# Patient Record
Sex: Male | Born: 2005
Health system: Southern US, Community
[De-identification: ages and names within clinical notes are randomized; demographics above are authoritative.]

## PROBLEM LIST (undated history)

## (undated) DIAGNOSIS — J45909 Unspecified asthma, uncomplicated: Secondary | ICD-10-CM

## (undated) DIAGNOSIS — F988 Other specified behavioral and emotional disorders with onset usually occurring in childhood and adolescence: Secondary | ICD-10-CM

---
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
## 2024-01-25 ENCOUNTER — Other Ambulatory Visit: Payer: Self-pay

## 2024-01-26 ENCOUNTER — Ambulatory Visit

## 2024-02-22 ENCOUNTER — Other Ambulatory Visit: Payer: Self-pay

## 2024-02-22 ENCOUNTER — Other Ambulatory Visit: Payer: Self-pay | Admitting: Urology

## 2024-02-23 ENCOUNTER — Other Ambulatory Visit: Payer: Self-pay

## 2024-02-23 ENCOUNTER — Other Ambulatory Visit: Payer: Self-pay | Admitting: Internal Medicine

## 2024-02-23 ENCOUNTER — Telehealth: Payer: Self-pay | Admitting: Pharmacy Technician

## 2024-02-23 ENCOUNTER — Other Ambulatory Visit (HOSPITAL_COMMUNITY): Payer: Self-pay

## 2024-02-23 DIAGNOSIS — L700 Acne vulgaris: Secondary | ICD-10-CM

## 2024-02-23 NOTE — Telephone Encounter (Signed)
 Pharmacy Patient Advocate Encounter   Received notification from CoverMyMeds that prior authorization for Tretinoin  0.025% cream is required/requested.   Insurance verification completed.   The patient is insured through [employer] [place] [employer].   Per test claim:  see below is preferred by the insurance.  If suggested medication is appropriate, Please send in a new RX and discontinue this one. If not, please advise as to why it's not appropriate so that we may request a Prior Authorization. Please note, some preferred medications may still require a PA.  If the suggested medications have not been trialed and there are no contraindications to their use, the PA will not be submitted, as it will not be approved.  Medicaid discontinued coverage of brand name Retin-A  on 12/23/2023 and the generic in non-preferred.

## 2024-02-24 ENCOUNTER — Other Ambulatory Visit: Payer: Self-pay | Admitting: Pharmacy Technician

## 2024-02-24 NOTE — Telephone Encounter (Signed)
 Therapy changed to Clindamycin -Benzoyl Per, Refr, gel .

## 2024-03-03 ENCOUNTER — Other Ambulatory Visit (HOSPITAL_COMMUNITY): Payer: Self-pay

## 2024-03-10 ENCOUNTER — Telehealth: Payer: Self-pay

## 2024-03-10 NOTE — Telephone Encounter (Unsigned)
 Copied from CRM #[id number]. Topic: Clinical - Prescription Issue >> Mar 10, 2024  3:46 PM Avram G wrote: Reason for CRM: patient would like to know if he can have this medication refill tretinoin  (RETIN-A ) 0.025 % cream    [id number] - [place], [place] - [address] AT [facility] [address] [place] [address] Phone: [phone] Fax: [phone]

## 2024-03-11 NOTE — Telephone Encounter (Signed)
 His insurance denied this.

## 2024-03-21 ENCOUNTER — Ambulatory Visit: Payer: MEDICAID

## 2024-03-21 VITALS — BP 139/87 | HR 111 | Ht 74.0 in | Wt 155.1 lb

## 2024-03-21 DIAGNOSIS — L700 Acne vulgaris: Secondary | ICD-10-CM

## 2024-03-21 DIAGNOSIS — R61 Generalized hyperhidrosis: Secondary | ICD-10-CM

## 2024-03-21 DIAGNOSIS — F411 Generalized anxiety disorder: Secondary | ICD-10-CM | POA: Diagnosis not present

## 2024-03-21 DIAGNOSIS — E559 Vitamin D deficiency, unspecified: Secondary | ICD-10-CM

## 2024-03-21 DIAGNOSIS — R748 Abnormal levels of other serum enzymes: Secondary | ICD-10-CM | POA: Diagnosis not present

## 2024-03-21 DIAGNOSIS — R7989 Other specified abnormal findings of blood chemistry: Secondary | ICD-10-CM

## 2024-03-21 MED ORDER — SERTRALINE HCL 100 MG PO TABS: 100.0000 mg | ORAL_TABLET | Freq: Every day | ORAL | 3 refills | Status: AC

## 2024-03-21 MED ORDER — ADAPALENE 0.1 % EX GEL: Freq: Every day | CUTANEOUS | 11 refills | Status: AC

## 2024-03-21 NOTE — Progress Notes (Unsigned)
 "  Established Patient Office Visit  Subjective   Patient ID: Michael Wilkins, male    DOB: 09/25/2005  Age: 18 y.o. MRN: [id number]  Chief Complaint  Patient presents with   Medical Management of Chronic Issues    Pt here for a 6 month follow up, pt has concerns of sweating a lot and he's not hot just Sweating wanting his TSH levels checked     HPI Discussed the use of AI scribe software for clinical note transcription with the patient, who gave verbal consent to proceed.  History of Present Illness    Michael Wilkins is a 18 year old male who presents with concerns about elevated TSH levels and medication refill issues.  Thyroid dysfunction - Elevated TSH levels noted on prior laboratory testing - No treatment initiated previously due to absence of symptoms at that time  Diaphoresis - Sweating occurs throughout the day and night - Described as an 'all day thing'  Medication refill issues - Tretinoin  prescription for facial use denied by insurance; reason unknown - Sertraline  taken daily; last filled in January and likely on last refill     Patient Active Problem List   Diagnosis Date Noted   Abnormal TSH 03/22/2024   Vitamin D  deficiency 03/22/2024   Elevated alkaline phosphatase level 03/22/2024   Diaphoresis 03/22/2024   Encounter for well adult exam with abnormal findings 07/24/2023   Need for HPV vaccination 07/24/2023   Penile lesion 08/05/2022   History of Wolff-Parkinson-White (WPW) syndrome 10/01/2021   S/P ablation of ventricular arrhythmia 05/09/2021   Acne vulgaris 04/14/2019   Generalized anxiety disorder 04/14/2019    ROS    Objective:     BP 139/87   Pulse (!) 111   Ht 6' 2 (1.88 m)   Wt 155 lb 1.9 oz (70.4 kg)   SpO2 94%   BMI 19.92 kg/m  BP Readings from Last 3 Encounters:  03/21/24 139/87  09/15/23 133/78  07/24/23 133/71   Wt Readings from Last 3 Encounters:  03/21/24 155 lb 1.9 oz (70.4 kg) (50%, Z= 0.00)*  07/24/23 158 lb 3.2 oz  (71.8 kg) (58%, Z= 0.21)*  05/21/23 150 lb (68 kg) (46%, Z= -0.10)*   * Growth percentiles are based on CDC (Boys, 2-20 Years) data.     Physical Exam Vitals and nursing note reviewed.  Constitutional:      Appearance: Normal appearance.  HENT:     Head: Normocephalic.  Eyes:     Extraocular Movements: Extraocular movements intact.     Pupils: Pupils are equal, round, and reactive to light.  Cardiovascular:     Rate and Rhythm: Normal rate and regular rhythm.  Pulmonary:     Effort: Pulmonary effort is normal.     Breath sounds: Normal breath sounds.  Musculoskeletal:     Cervical back: Normal range of motion and neck supple.  Neurological:     Mental Status: He is alert and oriented to person, place, and time.  Psychiatric:        Mood and Affect: Mood normal.        Thought Content: Thought content normal.        The ASCVD Risk score (Arnett DK, et al., 2019) failed to calculate for the following reasons:   The 2019 ASCVD risk score is only valid for ages 49 to 51   * - Cholesterol units were assumed    Assessment & Plan:   Problem List Items Addressed This Visit  Musculoskeletal and Integument   Acne vulgaris   Insurance denied tretinoin . Discussed alternative topical treatments. - Prescribed alternative topical gel. - Sent prescription to [facility] in [place].      Relevant Medications   adapalene  (DIFFERIN ) 0.1 % gel     Other   Generalized anxiety disorder   Continues sertraline  for management. - Refilled sertraline  prescription.      Relevant Medications   sertraline  (ZOLOFT ) 100 MG tablet   Abnormal TSH   Elevated TSH suggests possible hypothyroidism. No treatment initiated due to asymptomatic status. - Rechecked TSH levels.      Relevant Orders   TSH + free T4 (Completed)   Vitamin D  deficiency   Recheck vitamin D  levels.       Relevant Orders   VITAMIN D  25 Hydroxy (Vit-D Deficiency, Fractures) (Completed)   Elevated alkaline  phosphatase level - Primary   Elevated on previous labs.  Will recheck levels.       Relevant Orders   CMP14+EGFR (Completed)   Diaphoresis   Will obtain labs for further evaluation.       Relevant Orders   CBC with Differential/Platelet (Completed)   TSH + free T4 (Completed)    Return in about 6 months (around 09/19/2024) for chronic follow-up with PCP.    Leita Longs, FNP  "

## 2024-03-22 ENCOUNTER — Ambulatory Visit: Payer: Self-pay

## 2024-03-22 ENCOUNTER — Other Ambulatory Visit: Payer: Self-pay

## 2024-03-22 DIAGNOSIS — E039 Hypothyroidism, unspecified: Secondary | ICD-10-CM

## 2024-03-22 DIAGNOSIS — R748 Abnormal levels of other serum enzymes: Secondary | ICD-10-CM | POA: Insufficient documentation

## 2024-03-22 DIAGNOSIS — R61 Generalized hyperhidrosis: Secondary | ICD-10-CM | POA: Insufficient documentation

## 2024-03-22 DIAGNOSIS — R7989 Other specified abnormal findings of blood chemistry: Secondary | ICD-10-CM | POA: Insufficient documentation

## 2024-03-22 DIAGNOSIS — E559 Vitamin D deficiency, unspecified: Secondary | ICD-10-CM | POA: Insufficient documentation

## 2024-03-22 LAB — CMP14+EGFR
ALT: 21 IU/L (ref 0–44)
AST: 21 IU/L (ref 0–40)
Albumin: 4.6 g/dL (ref 4.3–5.2)
Alkaline Phosphatase: 132 IU/L — ABNORMAL HIGH (ref 51–125)
BUN/Creatinine Ratio: 10 (ref 9–20)
BUN: 9 mg/dL (ref 6–20)
Bilirubin Total: 0.3 mg/dL (ref 0.0–1.2)
CO2: 22 mmol/L (ref 20–29)
Calcium: 9.6 mg/dL (ref 8.7–10.2)
Chloride: 99 mmol/L (ref 96–106)
Creatinine, Ser: 0.88 mg/dL (ref 0.76–1.27)
Globulin, Total: 2.6 g/dL (ref 1.5–4.5)
Glucose: 147 mg/dL — ABNORMAL HIGH (ref 70–99)
Potassium: 3.6 mmol/L (ref 3.5–5.2)
Sodium: 140 mmol/L (ref 134–144)
Total Protein: 7.2 g/dL (ref 6.0–8.5)
eGFR: 127 mL/min/1.73

## 2024-03-22 LAB — CBC WITH DIFFERENTIAL/PLATELET
Basophils Absolute: 0.1 x10E3/uL (ref 0.0–0.2)
Basos: 1 %
EOS (ABSOLUTE): 0.2 x10E3/uL (ref 0.0–0.4)
Eos: 2 %
Hematocrit: 47.6 % (ref 37.5–51.0)
Hemoglobin: 15.1 g/dL (ref 13.0–17.7)
Immature Grans (Abs): 0 x10E3/uL (ref 0.0–0.1)
Immature Granulocytes: 0 %
Lymphocytes Absolute: 1.7 x10E3/uL (ref 0.7–3.1)
Lymphs: 17 %
MCH: 27 pg (ref 26.6–33.0)
MCHC: 31.7 g/dL (ref 31.5–35.7)
MCV: 85 fL (ref 79–97)
Monocytes Absolute: 0.5 x10E3/uL (ref 0.1–0.9)
Monocytes: 5 %
Neutrophils Absolute: 7.7 x10E3/uL — ABNORMAL HIGH (ref 1.4–7.0)
Neutrophils: 75 %
Platelets: 241 x10E3/uL (ref 150–450)
RBC: 5.59 x10E6/uL (ref 4.14–5.80)
RDW: 11.9 % (ref 11.6–15.4)
WBC: 10.2 x10E3/uL (ref 3.4–10.8)

## 2024-03-22 LAB — TSH+FREE T4
Free T4: 1.15 ng/dL (ref 0.93–1.60)
TSH: 12.4 u[IU]/mL — ABNORMAL HIGH (ref 0.450–4.500)

## 2024-03-22 LAB — VITAMIN D 25 HYDROXY (VIT D DEFICIENCY, FRACTURES): Vit D, 25-Hydroxy: 43.9 ng/mL (ref 30.0–100.0)

## 2024-03-22 MED ORDER — LEVOTHYROXINE SODIUM 25 MCG PO TABS: 25.0000 ug | ORAL_TABLET | Freq: Every day | ORAL | 5 refills | Status: AC

## 2024-03-22 NOTE — Assessment & Plan Note (Signed)
 Recheck vitamin D  levels

## 2024-03-22 NOTE — Assessment & Plan Note (Signed)
 Continues sertraline  for management. - Refilled sertraline  prescription.

## 2024-03-22 NOTE — Assessment & Plan Note (Signed)
 Elevated TSH suggests possible hypothyroidism. No treatment initiated due to asymptomatic status. - Rechecked TSH levels.

## 2024-03-22 NOTE — Assessment & Plan Note (Signed)
 Elevated on previous labs.  Will recheck levels.

## 2024-03-22 NOTE — Assessment & Plan Note (Signed)
 Insurance denied tretinoin . Discussed alternative topical treatments. - Prescribed alternative topical gel. - Sent prescription to [facility] in [place].

## 2024-03-22 NOTE — Assessment & Plan Note (Signed)
 Will obtain labs for further evaluation.

## 2024-09-19 ENCOUNTER — Ambulatory Visit: Payer: Self-pay
